# Patient Record
Sex: Female | Born: 1950 | Race: White | Hispanic: No | Marital: Married
Health system: Southern US, Community
[De-identification: ages and names within clinical notes are randomized; demographics above are authoritative.]

## PROBLEM LIST (undated history)

## (undated) DIAGNOSIS — K279 Peptic ulcer, site unspecified, unspecified as acute or chronic, without hemorrhage or perforation: Secondary | ICD-10-CM

## (undated) DIAGNOSIS — M8580 Other specified disorders of bone density and structure, unspecified site: Secondary | ICD-10-CM

## (undated) DIAGNOSIS — L509 Urticaria, unspecified: Secondary | ICD-10-CM

## (undated) DIAGNOSIS — Z9889 Other specified postprocedural states: Secondary | ICD-10-CM

## (undated) DIAGNOSIS — Z1379 Encounter for other screening for genetic and chromosomal anomalies: Principal | ICD-10-CM

## (undated) DIAGNOSIS — I739 Peripheral vascular disease, unspecified: Secondary | ICD-10-CM

## (undated) DIAGNOSIS — E785 Hyperlipidemia, unspecified: Secondary | ICD-10-CM

## (undated) DIAGNOSIS — R197 Diarrhea, unspecified: Secondary | ICD-10-CM

## (undated) DIAGNOSIS — B009 Herpesviral infection, unspecified: Secondary | ICD-10-CM

## (undated) DIAGNOSIS — C4490 Unspecified malignant neoplasm of skin, unspecified: Secondary | ICD-10-CM

## (undated) DIAGNOSIS — K219 Gastro-esophageal reflux disease without esophagitis: Secondary | ICD-10-CM

## (undated) DIAGNOSIS — E042 Nontoxic multinodular goiter: Secondary | ICD-10-CM

## (undated) HISTORY — DX: Peptic ulcer, site unspecified, unspecified as acute or chronic, without hemorrhage or perforation: K27.9

## (undated) HISTORY — DX: Encounter for other screening for genetic and chromosomal anomalies: Z13.79

## (undated) HISTORY — DX: Nontoxic multinodular goiter: E04.2

## (undated) HISTORY — DX: Other specified disorders of bone density and structure, unspecified site: M85.80

## (undated) HISTORY — DX: Hyperlipidemia, unspecified: E78.5

## (undated) HISTORY — DX: Diarrhea, unspecified: R19.7

## (undated) HISTORY — DX: Gastro-esophageal reflux disease without esophagitis: K21.9

## (undated) HISTORY — DX: Herpesviral infection, unspecified: B00.9

## (undated) HISTORY — DX: Urticaria, unspecified: L50.9

## (undated) HISTORY — DX: Peripheral vascular disease, unspecified: I73.9

## (undated) HISTORY — DX: Other specified postprocedural states: Z98.890

---

## 1998-07-22 ENCOUNTER — Other Ambulatory Visit: Admission: RE | Admit: 1998-07-22 | Discharge: 1998-07-22 | Payer: Self-pay | Admitting: Gynecology

## 1999-07-03 ENCOUNTER — Encounter: Payer: Self-pay | Admitting: Ophthalmology

## 1999-07-03 ENCOUNTER — Ambulatory Visit (HOSPITAL_COMMUNITY): Admission: RE | Admit: 1999-07-03 | Discharge: 1999-07-04 | Payer: Self-pay | Admitting: Ophthalmology

## 1999-07-28 ENCOUNTER — Other Ambulatory Visit: Admission: RE | Admit: 1999-07-28 | Discharge: 1999-07-28 | Payer: Self-pay | Admitting: Gynecology

## 1999-11-29 ENCOUNTER — Ambulatory Visit (HOSPITAL_COMMUNITY): Admission: RE | Admit: 1999-11-29 | Discharge: 1999-11-30 | Payer: Self-pay | Admitting: Ophthalmology

## 2000-08-05 ENCOUNTER — Other Ambulatory Visit: Admission: RE | Admit: 2000-08-05 | Discharge: 2000-08-05 | Payer: Self-pay | Admitting: Gynecology

## 2001-08-08 ENCOUNTER — Other Ambulatory Visit: Admission: RE | Admit: 2001-08-08 | Discharge: 2001-08-08 | Payer: Self-pay | Admitting: Gynecology

## 2002-08-28 ENCOUNTER — Other Ambulatory Visit: Admission: RE | Admit: 2002-08-28 | Discharge: 2002-08-28 | Payer: Self-pay | Admitting: Gynecology

## 2003-09-15 ENCOUNTER — Other Ambulatory Visit: Admission: RE | Admit: 2003-09-15 | Discharge: 2003-09-15 | Payer: Self-pay | Admitting: Gynecology

## 2004-07-06 ENCOUNTER — Ambulatory Visit (HOSPITAL_COMMUNITY): Admission: RE | Admit: 2004-07-06 | Discharge: 2004-07-06 | Payer: Self-pay

## 2004-09-14 ENCOUNTER — Other Ambulatory Visit: Admission: RE | Admit: 2004-09-14 | Discharge: 2004-09-14 | Payer: Self-pay | Admitting: Gynecology

## 2004-11-13 ENCOUNTER — Ambulatory Visit: Payer: Self-pay | Admitting: Family Medicine

## 2004-11-17 ENCOUNTER — Ambulatory Visit: Payer: Self-pay | Admitting: Family Medicine

## 2005-03-08 ENCOUNTER — Ambulatory Visit: Payer: Self-pay | Admitting: Family Medicine

## 2005-03-12 ENCOUNTER — Ambulatory Visit: Payer: Self-pay | Admitting: Family Medicine

## 2005-03-21 ENCOUNTER — Ambulatory Visit: Payer: Self-pay | Admitting: Family Medicine

## 2005-04-03 ENCOUNTER — Ambulatory Visit: Payer: Self-pay | Admitting: Family Medicine

## 2005-04-30 ENCOUNTER — Ambulatory Visit: Payer: Self-pay | Admitting: Family Medicine

## 2005-09-12 ENCOUNTER — Other Ambulatory Visit: Admission: RE | Admit: 2005-09-12 | Discharge: 2005-09-12 | Payer: Self-pay | Admitting: Gynecology

## 2005-11-28 ENCOUNTER — Ambulatory Visit: Payer: Self-pay | Admitting: Family Medicine

## 2006-09-12 ENCOUNTER — Other Ambulatory Visit: Admission: RE | Admit: 2006-09-12 | Discharge: 2006-09-12 | Payer: Self-pay | Admitting: Gynecology

## 2007-09-15 ENCOUNTER — Other Ambulatory Visit: Admission: RE | Admit: 2007-09-15 | Discharge: 2007-09-15 | Payer: Self-pay | Admitting: Gynecology

## 2008-09-14 ENCOUNTER — Other Ambulatory Visit: Admission: RE | Admit: 2008-09-14 | Discharge: 2008-09-14 | Payer: Self-pay | Admitting: Gynecology

## 2011-02-09 NOTE — Op Note (Signed)
[place]  Patient:    Glenda Stewart, Glenda Stewart                     MRN: [id number] Proc. Date: 11/29/99 Adm. Date:  [id number] Disc. Date: [id number] Attending:  Ernesto Rutherford                           Operative Report  PREOPERATIVE DIAGNOSES: 1. 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 2. Proliferative vitreal retinopathy, stage 1 right eye. 3. Rhegmatogenous detachment of the right eye. 4. History of repair retinal detachment, scleral buckle, retinal cryopexy    in October 2000. 5. 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  POSTOPERATIVE DIAGNOSES: 1. 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 2. Proliferative vitreal retinopathy, stage 1 right eye. 3. Rhegmatogenous detachment of the right eye. 4. History of repair retinal detachment, scleral buckle, retinal cryopexy    in October 2000. 5. 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  OPERATIONS: 1. Posterior vitrectomy with membrane peel left retinal tissues temporally    overlying the attachment. 2. Endolaser photocoagulation in a full sectorial fashion in the bed of detachment    as well as to surrounding attachment. 3. Retinal cryopexy, closed external also in the bed of the detachment and to the    edges of the retinal holes found to be at the edges of previous cryopexy scars. 4. Internal drainage of subretinal fluid after endodiathermy in the bed of the    detachment. 5. Injection of vitreous substitute SF6 20% of the right eye.  SURGEON:  Ernesto Rutherford, M.D.  ANESTHESIA:  General endotracheal anesthesia.  INDICATION:  The patient is a 60 year old woman who has had recurrent retinal detachment temporally posterior to the slope of the buckle on the right on the basis of proliferative vitreal retinopathy with ongoing pigment dispersed to the vitreous as well  as early cystoid macula edema.  She understands the need for retinal reattachment repair in an attempt to prevent further vision loss.  She understands the risks of anesthesia including the rare occurrence of death. Losses to the eye including hemorrhage, infection, scarring, need for further surgery, no change in vision, loss of vision, and progression of disease despite intervention. After appropriate signed consent was obtained, the patient was taken to the operating room.  DESCRIPTION OF PROCEDURE:  In the operating room, general endotracheal anesthesia instituted without difficulty.  The right periocular region sterilely prepped and draped in the usual ophthalmic fashion.  Lid speculum applied.  Conjunctiva peritomy fashioned in each of the three quadrants-infratemporal, supratemporal, and supranasal.  A 4 mm infusion secured 3.5 mm posterior to the limbus in the infratemporal quadrant, placed in the vitreous cavity verified visually.  Superior sclerotomy was then fashioned.  A Wild microscope placed in to position with a biome attachment.  Core vitrectomy was then begun.  Normal findings were of epiretinal tissues overlying the slope of buckle temporally in the right eye with extension over the shell of detachment posterior to the buckle of the right.  Two small retinal holes were identified at the edges of previous cryopexy applied.  These were treated ith closed retinal cryopexy.  Endodiathermy was then created in the bed of the shell of the detachment for drainage of internal subretinal fluid.  Vitreous skirt was then  trimmed 360 degrees.  All attachments to the iris and lens were visibly removed.  At this time, fluid-air exchange was then completed and drainage of subretinal fluid carried out under direct observation.  Fluid-air exchange was completed. The superior nasal sclerotomy closed with 7-0 Vicryl suture.  The air was exchanged for SF6 20% gas.  The  supratemporal sclerotomy closed.  The infusion cannula was then closed.  The infusion cannula was then removed and the sclerotomy closed with 7-0 Vicryl suture.  Conjunctiva closed with 7-0 Vicryl suture.  Subconjunctival injections of antibiotic and steroid applied inferiorly.  A sterile patch of Oxzul applied to the right eye.  Intraocular pressure had been assessed and found to e adequate less than 20 mm.  The intraocular lens remained centrally located with no displacement.  The patient tolerated the procedure well without complication. DD:  11/29/99 TD:  11/30/99 Job: [id number]

## 2016-09-24 HISTORY — PX: REDUCTION MAMMAPLASTY: SUR839

## 2017-03-08 ENCOUNTER — Other Ambulatory Visit: Payer: Self-pay | Admitting: Obstetrics & Gynecology

## 2017-03-08 DIAGNOSIS — R928 Other abnormal and inconclusive findings on diagnostic imaging of breast: Secondary | ICD-10-CM

## 2017-03-14 ENCOUNTER — Ambulatory Visit
Admission: RE | Admit: 2017-03-14 | Discharge: 2017-03-14 | Disposition: A | Discharge status: Home / Self Care | Payer: 59 | Source: Ambulatory Visit | Attending: Obstetrics & Gynecology | Admitting: Obstetrics & Gynecology

## 2017-03-14 DIAGNOSIS — R928 Other abnormal and inconclusive findings on diagnostic imaging of breast: Secondary | ICD-10-CM

## 2017-03-14 HISTORY — DX: Unspecified malignant neoplasm of skin, unspecified: C44.90

## 2017-04-11 ENCOUNTER — Other Ambulatory Visit: Payer: Medicare Other

## 2017-04-11 ENCOUNTER — Ambulatory Visit (HOSPITAL_BASED_OUTPATIENT_CLINIC_OR_DEPARTMENT_OTHER): Payer: Medicare Other | Admitting: Genetics

## 2017-04-11 ENCOUNTER — Encounter: Payer: Self-pay | Admitting: Genetics

## 2017-04-11 DIAGNOSIS — Z315 Encounter for genetic counseling: Secondary | ICD-10-CM

## 2017-04-11 DIAGNOSIS — Z803 Family history of malignant neoplasm of breast: Secondary | ICD-10-CM

## 2017-04-11 DIAGNOSIS — Z808 Family history of malignant neoplasm of other organs or systems: Secondary | ICD-10-CM | POA: Diagnosis not present

## 2017-04-11 DIAGNOSIS — Z809 Family history of malignant neoplasm, unspecified: Secondary | ICD-10-CM

## 2017-04-11 DIAGNOSIS — Z8041 Family history of malignant neoplasm of ovary: Secondary | ICD-10-CM | POA: Diagnosis not present

## 2017-04-11 DIAGNOSIS — Z85828 Personal history of other malignant neoplasm of skin: Secondary | ICD-10-CM

## 2017-04-11 DIAGNOSIS — Z800 Family history of malignant neoplasm of digestive organs: Secondary | ICD-10-CM | POA: Diagnosis not present

## 2017-04-11 NOTE — Progress Notes (Signed)
REFERRING PROVIDER: Juanda Chance, NP [address] [place], [place] [address]  PRIMARY PROVIDER:  Juanda Chance, NP  PRIMARY REASON FOR VISIT:  1. Family history of breast cancer   2. Family history of ovarian cancer   3. Family history of melanoma   4. Family history of colon cancer   5. Family history of brain cancer   6. Family history of cancer   7. Personal history of skin cancer      HISTORY OF PRESENT ILLNESS:   [name], a 66 y.o. female, was seen for a [place] cancer genetics consultation at the request of Glenda Stewart, [facility] due to a family history of cancer.  [name] presents to clinic today to discuss the possibility of a hereditary predisposition to cancer, genetic testing, and to further clarify her future cancer risks, as well as potential cancer risks for family members.   CANCER HISTORY: Earlier this year, at the age of 81, 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 This was treated with resection only.    HORMONAL RISK FACTORS:  Menarche was at age ~89.  First live birth at age 48.  Ovaries intact: no. TAH/BSO in her early-40s. Per [name]'s report, unsure if all of her ovaries were removed due to so much scar tissue. No evidence of ovarian tissue on recent exams. Hysterectomy: yes.  Menopausal status: postmenopausal.  HRT use: 1 year following TAH/BSO. Colonoscopy: yes; normal. Mammogram within the last year: yes. Number of breast biopsies: 0. Up to date with pelvic exams:  yes. Any excessive radiation exposure in the past:  no  Past Medical History:  Diagnosis Date  . Skin cancer     No past surgical history on file.  Social History   Social History  . Marital status: Married    Spouse name: N/A  . Number of children: N/A  . Years of education: N/A   Social History Main Topics  . Smoking status: Not on file  . Smokeless tobacco: Not on file  . Alcohol use Not on file  . Drug use:  Unknown  . Sexual activity: Not on file   Other Topics Concern  . Not on file   Social History Narrative  . No narrative on file     FAMILY HISTORY:  We obtained a detailed, 4-generation family history.  Significant diagnoses are listed below: Family History  Problem Relation Age of Onset  . Breast cancer Mother 86       d.92 bilateral breast cancers diagnosed at 94 and 22  . Breast cancer Sister 28       treated with lumpectomy and radiation  . Breast cancer Maternal Aunt 85       d.90  . Breast cancer Cousin 85  . Brain cancer Cousin 18       Neuroblastoma treated with resection and radiation. Daughter of maternal aunt Glenda Stewart.  . Breast cancer Maternal Aunt 60       d.87  . Ovarian cancer Maternal Aunt 78  . Breast cancer Cousin 84       Daughter of maternal aunt Glenda Stewart.  . Early death Maternal Uncle        d.child from unknown cause  . Leukemia Paternal Uncle        d.68s  . Cancer Paternal Uncle        d.60s unspecified type of cancer  . Colon cancer Cousin 58  Son of maternal aunt Glenda Stewart.  . Cancer Cousin        Daughter of paternal uncle Glenda Stewart. Treated at [facility] for unspecified form of cancer.   Glenda Stewart has a twin sister, Glenda Stewart, who had melanoma of her shoulder at age 59. Another sister, Glenda Stewart, was diagnosed with breast cancer at age 59 and is currently 82. Ms. Dicarlo brother, Glenda Stewart, has not had cancer. [name] has one biological son, age 73, who is without cancers. She also has an adopted son (no biological relationship) who is 54.  [name] mother died at age 6 from causes unrelated to cancer. However, her mother had a history of bilateral breast cancers diagnosed at ages 45 and 35. [name] only maternal uncle died as a child from unknown causes, though she does not think that he had a history of cancer. Both of [name]'s maternal aunts have had cancer. Her aunt Glenda Stewart was diagnosed with breast cancer at age 66 and died of other causes at  29. Glenda Stewart has two daughters and a son. Her daughter, Glenda Stewart, had neuroblastoma at age 95 and was treated with resection and radiation. At age 38, Glenda Stewart was diagnosed with breast cancer. Glenda Stewart's son is currently 30 and has a history of colon cancer at age 61. [name] aunt Glenda Stewart was diagnosed with breast cancer in her 54s, ovarian cancer in her late-70s, and died at 106. Glenda Stewart had two sons and a daughter. Her daughter, Glenda Stewart, was diagnosed with breast cancer at age 72 and is currently 30. [name] maternal grandmother died at 38 without cancer. Her maternal grandfather died after age 53 and was in a nursing home prior to his passing.  [name] father died at 9 without cancers. [name] had three paternal uncles and a paternal aunt. One uncle, Glenda Stewart, died in his late-60s with leukemia. Another uncle, Glenda Stewart, died in his 64s with an unspecified form of cancer. Glenda Stewart's daughter currently has an unspecified form of cancer and is being treated at [facility]. [name] third uncle, Glenda Stewart, died at 2 without cancers. Her aunt, Glenda Stewart, died at 12 without cancers. [name] paternal grandparents died in their 78s and 51s from heart problems and did not have cancers.   [name] thinks that her mother, sister Glenda Stewart), and maternal aunt Glenda Stewart) may have had genetic testing. To her knowledge, their testing was negative, but she does not have documentation of their testing or results. She states that though she may be able to obtain a copy of Glenda Stewart' results, she does not think she will be able to obtain her mother's or aunt 72. Patient's maternal and paternal ancestors are of Caucasian descent. There is no reported Ashkenazi Jewish ancestry. There is no known consanguinity.  GENETIC COUNSELING ASSESSMENT: Glenda Stewart is a 66 y.o. female with a family history that is suggestive of a hereditary cancer syndrome and predisposition to cancer. We, therefore, discussed and recommended the  following at today's visit.   DISCUSSION: We reviewed the characteristics, features and inheritance patterns of hereditary cancer syndromes. We also discussed genetic testing, including the appropriate family members to test, the process of testing, insurance coverage and turn-around-time for results. We discussed the implications of a negative, positive and/or variant of uncertain significant result. We recommended [name] pursue genetic testing for the Multi-Cancers Panel offered by [facility]. [facility]'s Multi-Cancer Panel includes analysis of the following 83 genes: ALK, APC, ATM, AXIN2, BAP1, BARD1, BLM, BMPR1A, BRCA1, BRCA2, BRIP1, CASR, CDC73, CDH1, CDK4, CDKN1B,  CDKN1C, CDKN2A, CEBPA, CHEK2, CTNNA1, DICER1, DIS3L2, EGFR, EPCAM, FH, FLCN, GATA2, GPC3, GREM1, HOXB13, HRAS, KIT, MAX, MEN1, MET, MITF, MLH1, MSH2, MSH3, MSH6, MUTYH, NBN, NF1, NF2, NTHL1, PALB2, PDGFRA, PHOX2B, PMS2, POLD1, POLE, POT1, PRKAR1A, PTCH1, PTEN, RAD50, RAD51C, RAD51D, RB1, RECQL4, RET, RUNX1, SDHA, SDHAF2, SDHB, SDHC, SDHD, SMAD4, SMARCA4, SMARCB1, SMARCE1, STK11, SUFU, TERC, TERT, TMEM127, TP53, TSC1, TSC2, VHL, WRN, WT1.  Based on [name]'s family history of cancer, she meets medical criteria for genetic testing. Despite that she meets criteria, she may still have an out of pocket cost. We discussed that if her out of pocket cost for testing is over $100, the laboratory will call and confirm whether she wants to proceed with testing.  If the out of pocket cost of testing is less than $100 she will be billed by the genetic testing laboratory.   The Tyrer-Cuzick (IBIS) Breast Cancer Risk Model was used to estimate [name]'s lifetime breast cancer risk. This model estimates risk through analysis of personal health history and family history. According to this model, her lifetime risk of developing breast cancer is 7.7%. For comparison, [name] lifetime breast cance risk was also estimated using the [place]. The [employer] model estimates Ms. Pascoe's Stewart to have breast cancer to age 69 to be 17.9%. This estimation assumes that Ms. Colgate's genetic testing is negative. If [name]'s genetic testing identifies a mutation, her cancer risks and management will likely be based on the presence of that mutation rather than this model, as it will likely confer a higher risk. A summary of both risk models is provided at the end of this note for reference.  Though [name]'s lifetime breast cancer risk does not appear to exceed the 20% threshold for high-risk screening/management, [name] expressed concern for her breast cancer risk and a desire to pursue bilateral mastectomies. We discussed that there certainly appears to be a clustering of breast cancer in her family which suggests familial risk. Furthermore, many of the breast cancers in her family members were diagnosed at later ages. Therefore, I encouraged [name] to discuss her individual situation with her referring physician and determine a breast cancer screening/risk reduction plan with which they are both comfortable.   PLAN: After considering the risks, benefits, and limitations, [name]  provided informed consent to pursue genetic testing and the blood sample was sent to [facility] for analysis of the 83-gene Multi-Cancers Panel. Results should be available within approximately 3 weeks' time, at which point they will be disclosed by telephone to [name], as will any additional recommendations warranted by these results. This information will also be available in Epic.   Lastly, we encouraged [name] to remain in contact with cancer genetics annually so that we can continuously update the family history and inform her of any changes in cancer genetics and testing that may be of benefit for this family.   Ms.  Glenda Stewart questions were answered to her satisfaction today. Our contact information  was provided should additional questions or concerns arise. Thank you for the referral and allowing [place] to Stewart in the care of your patient.   Mal Misty, MS, [facility] Certified Naval [email or web address] phone: [phone]  The patient was seen for a total of 40 minutes in face-to-face genetic counseling.    _______________________________________________________________________ For Office Staff:  Number of people involved in session: 1 Was an Intern/ student involved with case: no    Tyrer-Cuzick (IBIS) Breast Cancer  Risk Model Summary    [employer]aker [employer] Breast Cancer Risk Model Summary

## 2017-04-23 ENCOUNTER — Ambulatory Visit: Payer: Self-pay | Admitting: Genetics

## 2017-04-23 ENCOUNTER — Encounter: Payer: Self-pay | Admitting: Genetics

## 2017-04-23 ENCOUNTER — Telehealth: Payer: Self-pay | Admitting: Genetics

## 2017-04-23 DIAGNOSIS — Z1379 Encounter for other screening for genetic and chromosomal anomalies: Secondary | ICD-10-CM

## 2017-04-23 HISTORY — DX: Encounter for other screening for genetic and chromosomal anomalies: Z13.79

## 2017-04-23 NOTE — Progress Notes (Signed)
HPI: Ms. Spraggins was previously seen in the [facility] clinic on 04/11/2017 due to a family history of cancer and concerns regarding a hereditary predisposition to cancer. Please refer to our prior cancer genetics clinic note for more information regarding Ms. Egge's medical, social and family histories, and our assessment and recommendations, at the time. Ms. Lint recent genetic test results were disclosed to her, as were recommendations warranted by these results. These results and recommendations are discussed in more detail below.  CANCER HISTORY: Earlier this year, at the age of 36, Ms. 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 This was treated with resection only.    FAMILY HISTORY:  We obtained a detailed, 4-generation family history.  Significant diagnoses are listed below: Family History  Problem Relation Age of Onset  . Breast cancer Mother 57       d.92 bilateral breast cancers diagnosed at 24 and 77  . Breast cancer Sister 99       treated with lumpectomy and radiation  . Breast cancer Maternal Aunt 85       d.90  . Breast cancer Cousin 39  . Brain cancer Cousin 18       Neuroblastoma treated with resection and radiation. Daughter of maternal aunt [name].  . Breast cancer Maternal Aunt 60       d.87  . Ovarian cancer Maternal Aunt 78  . Breast cancer Cousin 45       Daughter of maternal aunt [name].  . Early death Maternal Uncle        d.child from unknown cause  . Leukemia Paternal Uncle        d.68s  . Cancer Paternal Uncle        d.60s unspecified type of cancer  . Colon cancer Cousin 56       Son of maternal aunt [name].  . Cancer Cousin        Daughter of paternal uncle Rod. Treated at [facility] for unspecified form of cancer.   Ms. Cumpton has a twin sister, [name], who had melanoma of her shoulder at age 56. Another sister, [name], was diagnosed with breast cancer at age 71 and is currently 51. Ms. Stroh brother,  [name], has not had cancer. Ms. Iman has one biological son, age 48, who is without cancers. She also has an adopted son (no biological relationship) who is 24.  Ms. Mooradian mother died at age 37 from causes unrelated to cancer. However, her mother had a history of bilateral breast cancers diagnosed at ages 82 and 60. Ms. Westergren only maternal uncle died as a child from unknown causes, though she does not think that he had a history of cancer. Both of Ms. Hillman's maternal aunts have had cancer. Her aunt [name] was diagnosed with breast cancer at age 25 and died of other causes at 62. [name] has two daughters and a son. Her daughter, [name], had neuroblastoma at age 70 and was treated with resection and radiation. At age 47, [name] was diagnosed with breast cancer. [name]'s son is currently 45 and has a history of colon cancer at age 24. Ms. Paro aunt [name] was diagnosed with breast cancer in her 94s, ovarian cancer in her late-70s, and died at 63. [name] had two sons and a daughter. Her daughter, [name], was diagnosed with breast cancer at age 61 and is currently 57. Ms. Koble maternal grandmother died at 31 without cancer. Her maternal grandfather died after age 1 and was  in a nursing home prior to his passing.  Ms. Shaheen father died at 67 without cancers. Ms. Gierke had three paternal uncles and a paternal aunt. One uncle, [name], died in his late-60s with leukemia. Another uncle, Rod, died in his 71s with an unspecified form of cancer. Rod's daughter currently has an unspecified form of cancer and is being treated at [facility]. Ms. Mccarrell third uncle, [name], died at 3 without cancers. Her aunt, [name], died at 50 without cancers. Ms. Azure paternal grandparents died in their 78s and 79s from heart problems and did not have cancers.   Ms. Masters thinks that her mother, sister [name]), and maternal aunt [name]) may have had genetic testing. To her knowledge, their testing  was negative, but she does not have documentation of their testing or results. She states that though she may be able to obtain a copy of [name]' results, she does not think she will be able to obtain her mother's or aunt 15. Patient's maternal and paternal ancestors are of Caucasian descent. There is no reported Ashkenazi Jewish ancestry. There is no known consanguinity.  GENETIC TEST RESULTS: Genetic testing performed through [facility]'s Multi-Cancers Panel reported out on 04/18/2017 showed no pathogenic mutations. [facility] Multi-Cancer Panel includes analysis of the following 83 genes: ALK, APC, ATM, AXIN2, BAP1, BARD1, BLM, BMPR1A, BRCA1, BRCA2, BRIP1, CASR, CDC73, CDH1, CDK4, CDKN1B, CDKN1C, CDKN2A, CEBPA, CHEK2, CTNNA1, DICER1, DIS3L2, EGFR, EPCAM, FH, FLCN, GATA2, GPC3, GREM1, HOXB13, HRAS, KIT, MAX, MEN1, MET, MITF, MLH1, MSH2, MSH3, MSH6, MUTYH, NBN, NF1, NF2, NTHL1, PALB2, PDGFRA, PHOX2B, PMS2, POLD1, POLE, POT1, PRKAR1A, PTCH1, PTEN, RAD50, RAD51C, RAD51D, RB1, RECQL4, RET, RUNX1, SDHA, SDHAF2, SDHB, SDHC, SDHD, SMAD4, SMARCA4, SMARCB1, SMARCE1, STK11, SUFU, TERC, TERT, TMEM127, TP53, TSC1, TSC2, VHL, WRN, WT1.  The test report will be scanned into EPIC and will be located under the Molecular Pathology section of the Results Review tab.A portion of the result report is included below for reference.    We discussed with Ms. Silfies that since the current genetic testing is not perfect, it is possible there may be a gene mutation in one of these genes that current testing cannot detect, but that chance is small. We also discussed, that it is possible that another gene that has not yet been discovered, or that we have not yet tested, is responsible for the cancer diagnoses in the family. Therefore, important to remain in touch with cancer genetics in the future so that we can continue to offer Ms. Remmers the most up to date genetic testing.   CANCER SCREENING RECOMMENDATIONS: This normal  result indicates that it is unlikely Ms. Chock has an increased risk of cancer due to a mutation in one of these genes. However, given Ms. Poppe's family history of cancers, we must interpret these negative results with some caution.  Families with features suggestive of hereditary risk for cancer tend to have multiple family members with cancer, diagnoses in multiple generations and diagnoses before the age of 64. Ms. Rings family exhibits some of these features. Thus this result may simply reflect our current inability to detect all mutations within these genes or there may be a different gene that has not yet been discovered or tested. Because no causative or actionable mutations were identified, Ms. Morey was advised to continue following the cancer screening guidelines provided by her primary healthcare providers.   The Tyrer-Cuzick (IBIS) Breast Cancer Risk Model wasused to estimate Ms. Wrobleski's lifetime breast cancer risk. This model estimates risk through analysis  of personal health history and family history. According to this model, herlifetime risk of developing breast cancer is 7.7%. For comparison, Ms. Dlugosz lifetime breast cance risk was also estimated using the [place]. The Baker Janus model estimates Ms. Geister's chance to have breast cancer to age 59 to be 17.9%. A summary of both risk models is provided at the end of this note for reference.  Though Ms. Isaacs's lifetime breast cancer risk does not appear to exceed the 20% threshold for high-risk screening/management, Ms. Jemmott expressed concern for her breast cancer risk and a desire to pursue bilateral mastectomies. We discussed that there certainly appears to be a clustering of breast cancer in her family which suggests familial risk. Furthermore, many of the breast cancers in her family members were diagnosed at later ages. Therefore, I encouraged Ms. Chauncey to discuss her individual situation  with her referring physician and determine a breast cancer screening/risk reduction plan with which they are both comfortable.  RECOMMENDATIONS FOR FAMILY MEMBERS: Women in this family might be at some increased risk of developing cancer, over the general population risk, simply due to the family history of cancer. We recommended women in this family have a yearly mammogram beginning at age 67, or 84 years younger than the earliest onset of cancer, an annual clinical breast exam, and perform monthly breast self-exams. Women in this family should also have a gynecological exam as recommended by their primary provider. All family members should have a colonoscopy by age 55.  Ms. Kowalchuk reports that her mother, sister [name], and maternal aunt [name] have all had negative genetic testing. However, their results were not available for review to determine the extent of their testing. Therefore, I recommend that any of Ms. Terwilliger's affected living relatives consult their physicians and/or a genetic counselor regarding their previous genetic testing and whether additional testing is indicated. Other, unaffected, family members also appear to be candidates for genetic counseling and testing and should consult their physicians about their options. Ms. Lukins was encouraged to obtain a copy of any tested family members' genetic testing result reports as this could give [place] valuable information to assist [place] in interpretation of Ms. Boettner's negative results.   FOLLOW-UP: Lastly, we discussed with Ms. Bobak that cancer genetics is a rapidly advancing field and it is possible that new genetic tests will be appropriate for her and/or her family members in the future. We encouraged her to remain in contact with cancer genetics on an annual basis so we can update her personal and family histories and let her know of advances in cancer genetics that may benefit this family.   Our contact number was provided. Ms.  Gowens questions were answered to her satisfaction, and she knows she is welcome to call us at anytime with additional questions or concerns.   Mal Misty, MS, [facility] Certified Genetic Counselor [email or web address]    Tyrer-Cuzick (IBIS) Breast Cancer Risk Model Summary    [facility]ail Breast Cancer Risk Model Summary

## 2017-04-23 NOTE — Telephone Encounter (Deleted)
-----   Message from Mal Misty sent at 04/11/2017  1:00 PM EDT ----- Regarding: Call Results TC risk 7.7% Gail risk ~18%. Pt desires bilateral mastectomy. Encourage to discuss option with referring provider/surgeon. Route results to Juanda Chance, [facility], [facility].

## 2017-04-23 NOTE — Telephone Encounter (Signed)
Reviewed that germline genetic testing revealed no pathogenic mutations. This is considered to be a negative result. Testing was performed through [facility]'s 83-gene Multi-Cancers Panel. [facility]'s Multi-Cancer Panel includes analysis of the following 83 genes: ALK, APC, ATM, AXIN2, BAP1, BARD1, BLM, BMPR1A, BRCA1, BRCA2, BRIP1, CASR, CDC73, CDH1, CDK4, CDKN1B, CDKN1C, CDKN2A, CEBPA, CHEK2, CTNNA1, DICER1, DIS3L2, EGFR, EPCAM, FH, FLCN, GATA2, GPC3, GREM1, HOXB13, HRAS, KIT, MAX, MEN1, MET, MITF, MLH1, MSH2, MSH3, MSH6, MUTYH, NBN, NF1, NF2, NTHL1, PALB2, PDGFRA, PHOX2B, PMS2, POLD1, POLE, POT1, PRKAR1A, PTCH1, PTEN, RAD50, RAD51C, RAD51D, RB1, RECQL4, RET, RUNX1, SDHA, SDHAF2, SDHB, SDHC, SDHD, SMAD4, SMARCA4, SMARCB1, SMARCE1, STK11, SUFU, TERC, TERT, TMEM127, TP53, TSC1, TSC2, VHL, WRN, WT1.  For more detailed discussion, please see genetic counseling documentation from 04/23/2017. Result report dated 04/18/2017.

## 2018-04-17 ENCOUNTER — Other Ambulatory Visit: Payer: Self-pay | Admitting: Obstetrics & Gynecology

## 2018-04-17 DIAGNOSIS — R928 Other abnormal and inconclusive findings on diagnostic imaging of breast: Secondary | ICD-10-CM

## 2018-04-29 ENCOUNTER — Ambulatory Visit
Admission: RE | Admit: 2018-04-29 | Discharge: 2018-04-29 | Disposition: A | Discharge status: Home / Self Care | Payer: Medicare Other | Source: Ambulatory Visit | Attending: Obstetrics & Gynecology | Admitting: Obstetrics & Gynecology

## 2018-04-29 ENCOUNTER — Ambulatory Visit: Payer: Medicare Other

## 2018-04-29 ENCOUNTER — Other Ambulatory Visit: Payer: Self-pay | Admitting: Obstetrics & Gynecology

## 2018-04-29 DIAGNOSIS — R928 Other abnormal and inconclusive findings on diagnostic imaging of breast: Secondary | ICD-10-CM

## 2018-04-29 DIAGNOSIS — N6489 Other specified disorders of breast: Secondary | ICD-10-CM

## 2018-10-31 ENCOUNTER — Ambulatory Visit
Admission: RE | Admit: 2018-10-31 | Discharge: 2018-10-31 | Disposition: A | Discharge status: Home / Self Care | Payer: Medicare Other | Source: Ambulatory Visit | Attending: Obstetrics & Gynecology | Admitting: Obstetrics & Gynecology

## 2018-10-31 ENCOUNTER — Other Ambulatory Visit: Payer: Self-pay | Admitting: Obstetrics & Gynecology

## 2018-10-31 ENCOUNTER — Ambulatory Visit: Payer: Medicare Other

## 2018-10-31 DIAGNOSIS — N6489 Other specified disorders of breast: Secondary | ICD-10-CM

## 2018-11-03 ENCOUNTER — Other Ambulatory Visit: Payer: Self-pay | Admitting: Obstetrics & Gynecology

## 2019-05-01 ENCOUNTER — Other Ambulatory Visit: Payer: Medicare Other

## 2021-01-20 IMAGING — MG DIGITAL DIAGNOSTIC UNILATERAL LEFT MAMMOGRAM WITH TOMO AND CAD
4 series · 4 of 12 positions shown · non-contrast
Comparison: 05/09/2018

CLINICAL DATA: Six-month follow-up for asymmetry in the LEFT
breast. Patient had bilateral breast reduction in 8410.

EXAM:
DIGITAL DIAGNOSTIC UNILATERAL LEFT MAMMOGRAM WITH CAD AND TOMO

[L CC synth-2D]
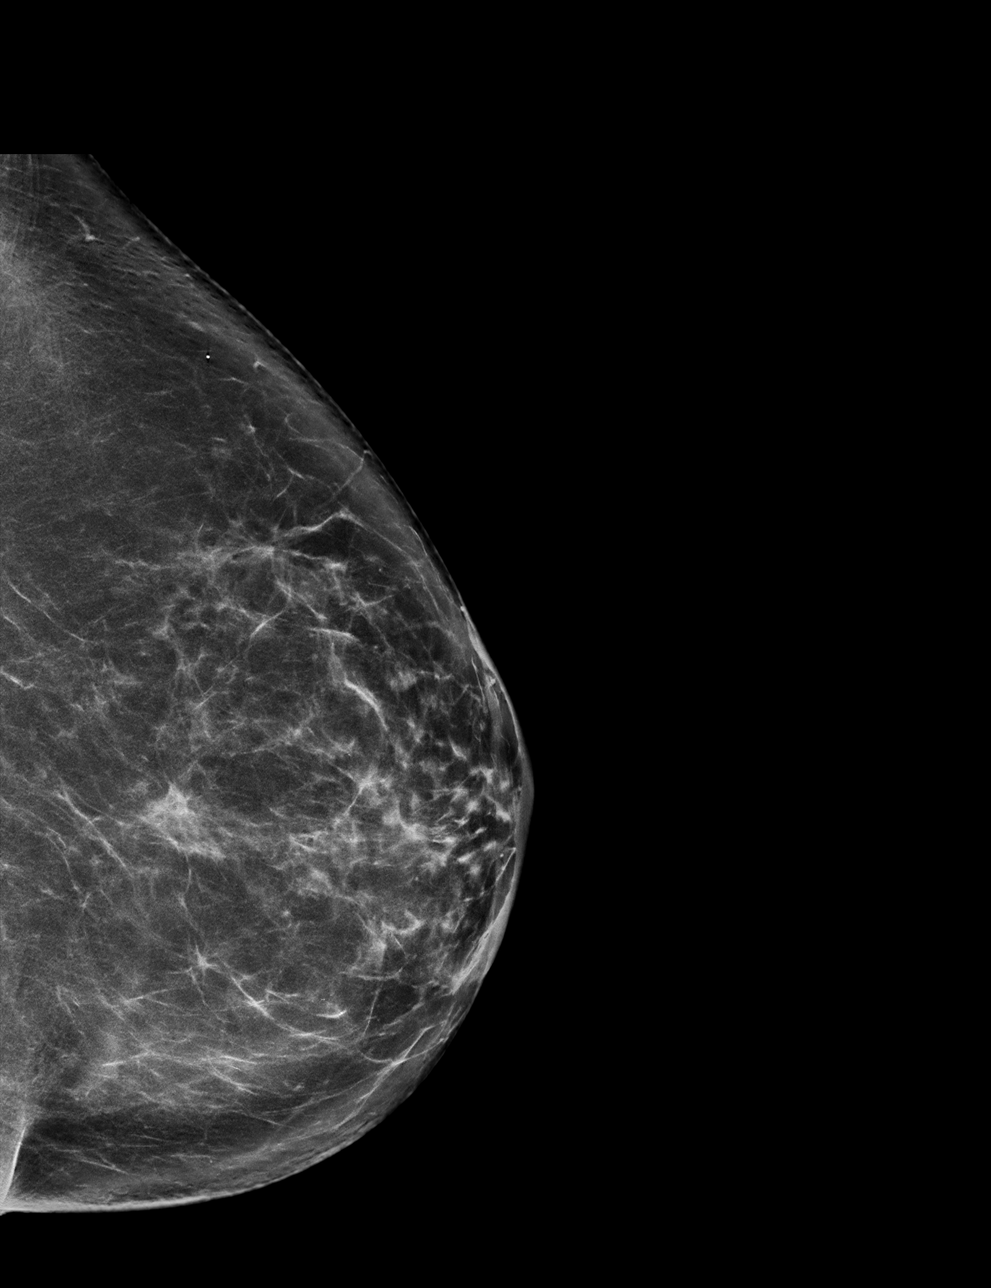

[L MLO synth-2D]
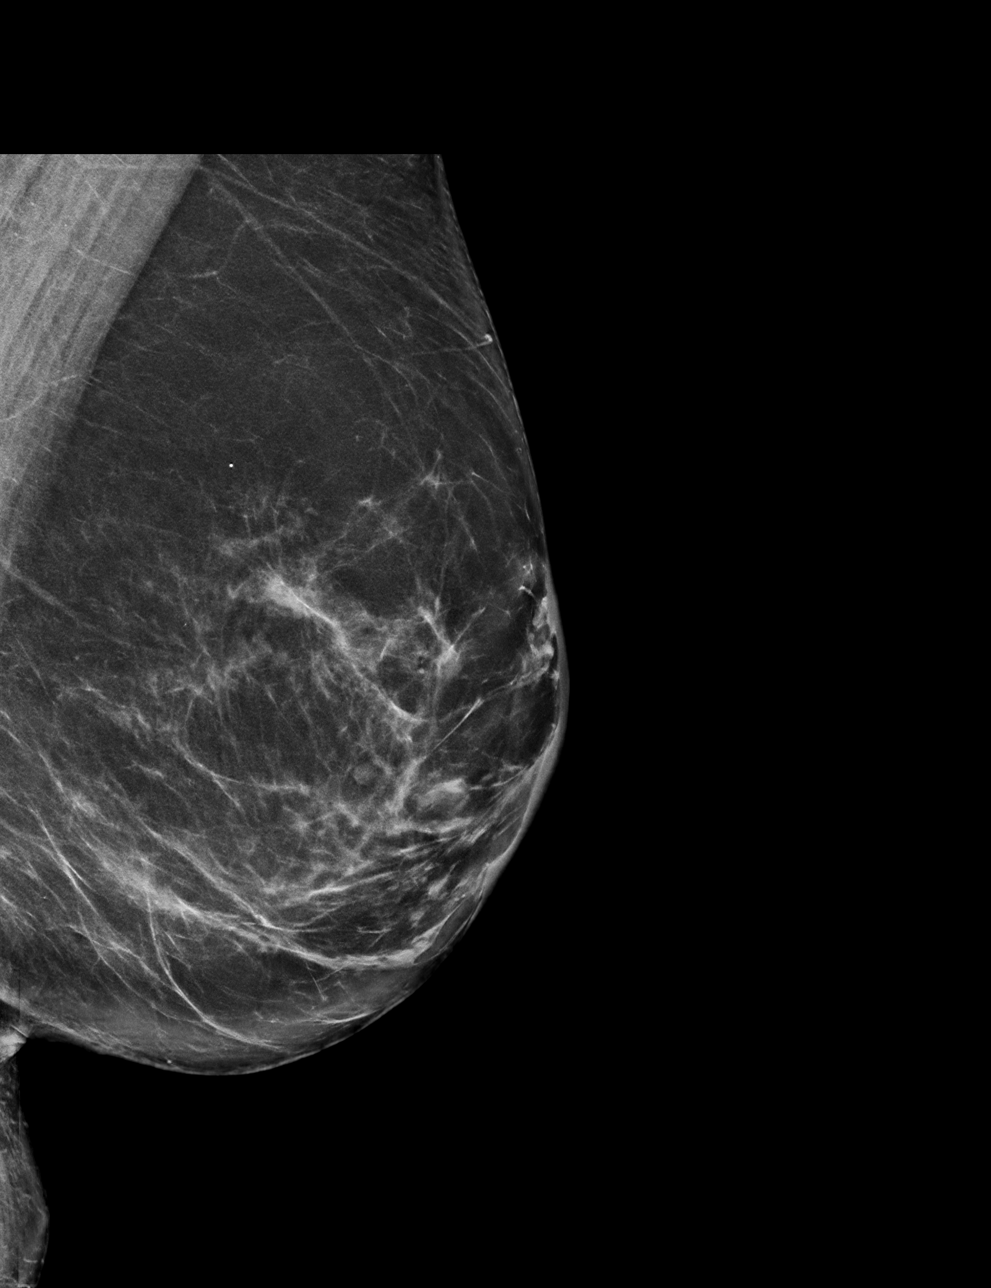

[L CC tomo · tomo slice 39/78.0]
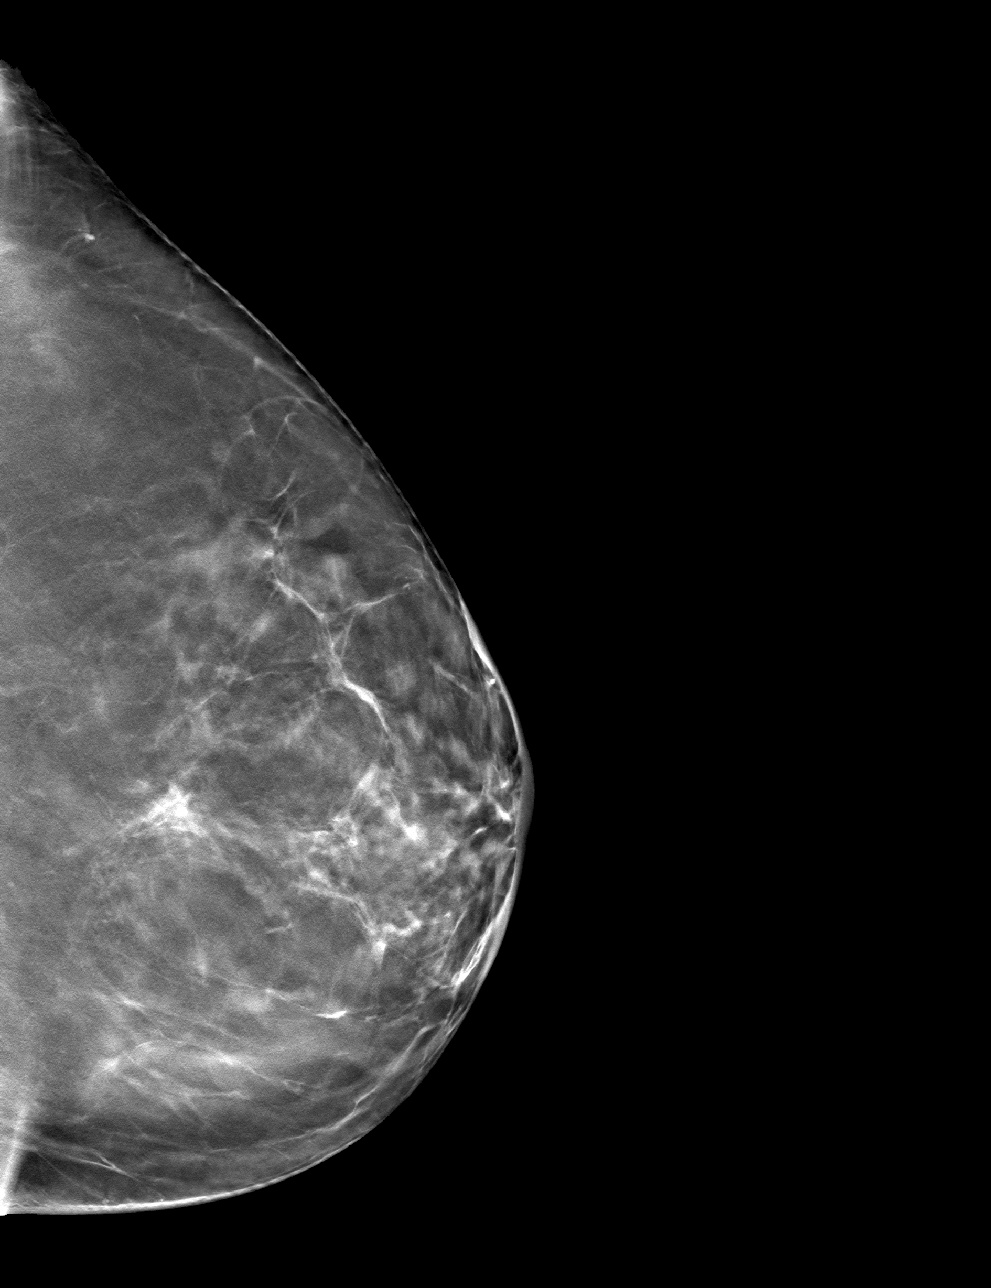

[L MLO tomo · tomo slice 39/78.0]
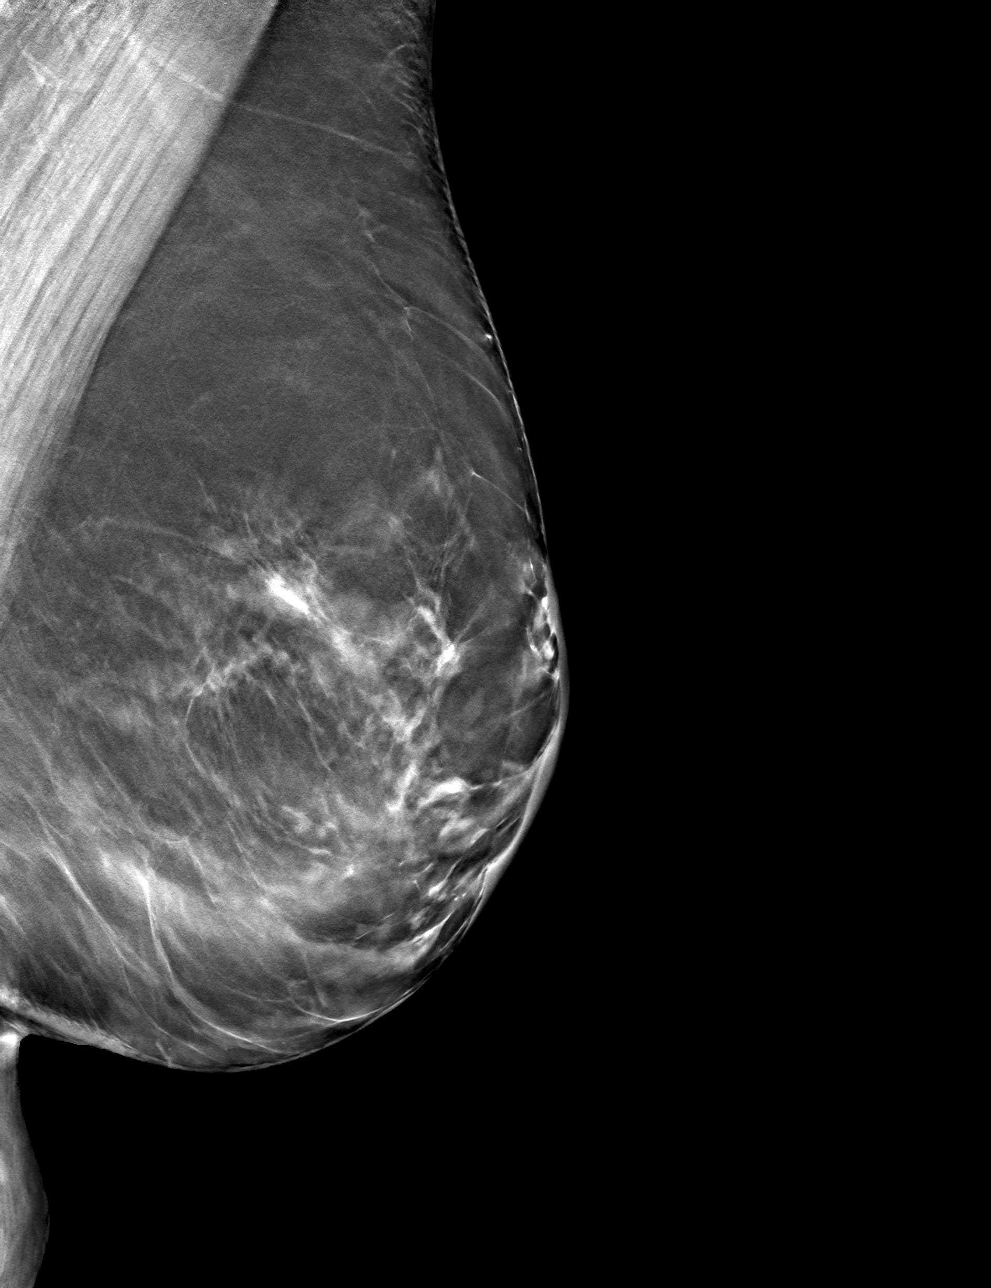

[4 of 12 positions shown; findings below may reference images not displayed]

ACR Breast Density Category b: There are scattered areas of
fibroglandular density.
FINDINGS: There is persistent asymmetry in the UPPER central portion of the
LEFT breast, not associated distortion or discrete mass. Findings
are favored to represent asymmetric benign breast parenchyma versus
scar from prior surgery. No suspicious mass, distortion, or
microcalcifications are identified to suggest presence of
malignancy.

Mammographic images were processed with CAD.
IMPRESSION: No mammographic evidence for malignancy.

RECOMMENDATION:
Bilateral diagnostic mammogram is recommended in 6 months.

I have discussed the findings and recommendations with the patient.
Results were also provided in writing at the conclusion of the
visit. If applicable, a reminder letter will be sent to the patient
regarding the next appointment.

BI-RADS CATEGORY  3: Probably benign.

## 2021-05-16 NOTE — H&P (Signed)
 [facility]  History and Physical   Assessment  1. S/p B-breast reconstruction with ptosis and deformity of reconstruction  Plan  1. B-mastopexy  2. Possible B-implant exchange  History  Glenda Stewart is a 70 y.o. female who presents with implant based breast reconstruction History of Present Illness For surgery  Past Medical History:  Diagnosis Date  . 2019 novel coronavirus disease (COVID-19) 02/2021   Tested positive with home kit  . Anesthesia    Hard time waking up & smothering  . Anxiety    per pt  . Chronic kidney disease 2017   stage 3  . Compromised kidney function    per pt 50%  . Dental crown present    per pt  . H/O vocal cord paralysis   . Hypertension    per pt  . Stomach ulcer    per pt   Past Surgical History:  Procedure Laterality Date  . Appendectomy  1984   per pt  . Breast reconstruction Bilateral 02/18/2019   Dr Rudy   . Cataract extraction, bilateral  1993   per pt  . Cholecystectomy  2007   per pt  . Hysterectomy  1996   per pt  . Larynx surgery  2006   per pt had trach removed prior to leaving hospital  . Mastectomy Bilateral 02/18/2019   Dr Corinne   . Reduction mammaplasty Bilateral 06/2017   Dr Rudy   . Retinal detachment surgery     per pt    No Known Allergies Prior to Admission medications   Medication Sig Start Date End Date Taking? Authorizing Provider  alpha-tocopherol (VITAMIN E) 200 UNIT capsule Take 200 Units by mouth every morning.     Historical Provider, MD  amLODIPine besylate (NORVASC) 10 mg tablet Take 10 mg by mouth every morning.  11/08/18   Historical Provider, MD  Calcium Citrate (CITRACAL PO) Take 1,200 mg by mouth daily.     Historical Provider, MD  Docusate Sodium (STOOL SOFTENER LAXATIVE PO) Take by mouth every morning.    Historical Provider, MD  Doxylamine Succinate, Sleep, (UNISOM PO) Take by mouth at bedtime.    Historical Provider, MD  escitalopram oxalate  (LEXAPRO) 10 mg tablet Take 10 mg by mouth every evening.  11/08/18   Historical Provider, MD  ezetimibe (ZETIA) 10 MG tablet Take 10 mg by mouth every morning.  09/28/18   Historical Provider, MD  Melatonin 1 MG/4ML LIQD Take by mouth at bedtime.     Historical Provider, MD  Multiple Vitamin (MULTIVITAMIN) tablet Take 1 tablet by mouth every morning.     Historical Provider, MD  Multiple Vitamins-Minerals (OCUVITE-LUTEIN PO) Take by mouth at bedtime.    Historical Provider, MD  pantoprazole sodium (PROTONIX) 40 mg tablet Take 40 mg by mouth every morning.  11/08/18   Historical Provider, MD  promethazine (PHENERGAN) 12.5 MG tablet Take one tablet (12.5 mg dose) by mouth every 6 (six) hours as needed for Nausea for up to 7 days. Patient not taking: Reported on 02/26/2019 02/18/19 02/25/19  Lynwood KANDICE Rudy, MD  rosuvastatin calcium (CRESTOR) 40 mg tablet Take 40 mg by mouth at bedtime.  09/26/18   Historical Provider, MD  VITAMIN D, CHOLECALCIFEROL, PO Take by mouth at bedtime.     Historical Provider, MD   Social History   Socioeconomic History  . Marital status: Married    Spouse name: Not on file  . Number of children: Not on file  .  Years of education: Not on file  . Highest education level: Not on file  Occupational History  . Not on file  Tobacco Use  . Smoking status: Never Smoker  . Smokeless tobacco: Never Used  Vaping Use  . Vaping Use: Never used  Substance and Sexual Activity  . Alcohol use: Yes    Comment: 2 drinks per week  . Drug use: Never  . Sexual activity: Not on file  Other Topics Concern  . Not on file  Social History Narrative  . Not on file   Social Determinants of Health   Financial Resource Strain: Not on file  Food Insecurity: Not on file  Transportation Needs: Not on file  Physical Activity: Not on file  Stress: No Stress Concern Present  . Feeling of Stress : Not at all  Social Connections: Not on file  Intimate Partner Violence: Not on file  Housing  Stability: Not on file   No family history on file. Review of Systems:  (-) fevers, chills, night sweats, CP, SOB       Physical Examination           Physical Exam:  CV:  RRR, (-) M/R/G Chest:  Good air movement, CTA-B Abdomen: Soft, NTND  Results  Labs:  No results found for this or any previous visit (from the past 24 hour(s)). Imaging: No results found.   ECG: No results found.  Electronically signed: Lynwood KANDICE Centers, MD 05/16/2021 / 2:12 PM

## 2023-04-09 ENCOUNTER — Other Ambulatory Visit: Payer: Self-pay | Admitting: Obstetrics & Gynecology

## 2023-04-09 DIAGNOSIS — E049 Nontoxic goiter, unspecified: Secondary | ICD-10-CM

## 2023-04-16 ENCOUNTER — Ambulatory Visit
Admission: RE | Admit: 2023-04-16 | Discharge: 2023-04-16 | Disposition: A | Discharge status: Home / Self Care | Payer: Medicare PPO | Source: Ambulatory Visit | Attending: Obstetrics & Gynecology | Admitting: Obstetrics & Gynecology

## 2023-04-16 DIAGNOSIS — E049 Nontoxic goiter, unspecified: Secondary | ICD-10-CM

## 2024-06-22 ENCOUNTER — Other Ambulatory Visit: Payer: Self-pay

## 2024-06-22 ENCOUNTER — Inpatient Hospital Stay: Attending: Hematology and Oncology | Admitting: Hematology and Oncology

## 2024-06-22 ENCOUNTER — Encounter: Payer: Self-pay | Admitting: Hematology and Oncology

## 2024-06-22 ENCOUNTER — Telehealth: Payer: Self-pay | Admitting: Hematology and Oncology

## 2024-06-22 ENCOUNTER — Other Ambulatory Visit: Payer: Self-pay | Admitting: Hematology and Oncology

## 2024-06-22 ENCOUNTER — Inpatient Hospital Stay

## 2024-06-22 VITALS — BP 139/66 | HR 58 | Temp 97.7°F | Resp 14 | Ht 64.5 in | Wt 130.3 lb

## 2024-06-22 DIAGNOSIS — Z803 Family history of malignant neoplasm of breast: Secondary | ICD-10-CM | POA: Insufficient documentation

## 2024-06-22 DIAGNOSIS — Z806 Family history of leukemia: Secondary | ICD-10-CM | POA: Diagnosis not present

## 2024-06-22 DIAGNOSIS — Z79899 Other long term (current) drug therapy: Secondary | ICD-10-CM | POA: Diagnosis not present

## 2024-06-22 DIAGNOSIS — Z808 Family history of malignant neoplasm of other organs or systems: Secondary | ICD-10-CM | POA: Diagnosis not present

## 2024-06-22 DIAGNOSIS — Z79620 Long term (current) use of immunosuppressive biologic: Secondary | ICD-10-CM | POA: Insufficient documentation

## 2024-06-22 DIAGNOSIS — N183 Chronic kidney disease, stage 3 unspecified: Secondary | ICD-10-CM | POA: Diagnosis not present

## 2024-06-22 DIAGNOSIS — Z8041 Family history of malignant neoplasm of ovary: Secondary | ICD-10-CM | POA: Diagnosis not present

## 2024-06-22 DIAGNOSIS — D472 Monoclonal gammopathy: Secondary | ICD-10-CM

## 2024-06-22 DIAGNOSIS — Z800 Family history of malignant neoplasm of digestive organs: Secondary | ICD-10-CM | POA: Diagnosis not present

## 2024-06-22 LAB — CMP (CANCER CENTER ONLY)
ALT: 46 U/L — ABNORMAL HIGH (ref 0–44)
AST: 62 U/L — ABNORMAL HIGH (ref 15–41)
Albumin: 4.6 g/dL (ref 3.5–5.0)
Alkaline Phosphatase: 82 U/L (ref 38–126)
Anion gap: 15 (ref 5–15)
BUN: 16 mg/dL (ref 8–23)
CO2: 21 mmol/L — ABNORMAL LOW (ref 22–32)
Calcium: 10.9 mg/dL — ABNORMAL HIGH (ref 8.9–10.3)
Chloride: 102 mmol/L (ref 98–111)
Creatinine: 1.15 mg/dL — ABNORMAL HIGH (ref 0.44–1.00)
GFR, Estimated: 50 mL/min — ABNORMAL LOW (ref >60)
Glucose, Bld: 88 mg/dL (ref 70–99)
Potassium: 4.1 mmol/L (ref 3.5–5.1)
Sodium: 138 mmol/L (ref 135–145)
Total Bilirubin: 0.8 mg/dL (ref 0.0–1.2)
Total Protein: 8.3 g/dL — ABNORMAL HIGH (ref 6.5–8.1)

## 2024-06-22 LAB — IRON AND TIBC
Iron: 107 ug/dL (ref 28–170)
Saturation Ratios: 35 % — ABNORMAL HIGH (ref 10.4–31.8)
TIBC: 305 ug/dL (ref 250–450)
UIBC: 198 ug/dL

## 2024-06-22 LAB — CBC WITH DIFFERENTIAL (CANCER CENTER ONLY)
Abs Immature Granulocytes: 0.02 K/uL (ref 0.00–0.07)
Basophils Absolute: 0 K/uL (ref 0.0–0.1)
Basophils Relative: 0 %
Eosinophils Absolute: 0.1 K/uL (ref 0.0–0.5)
Eosinophils Relative: 1 %
HCT: 42.7 % (ref 36.0–46.0)
Hemoglobin: 14.7 g/dL (ref 12.0–15.0)
Immature Granulocytes: 0 %
Lymphocytes Relative: 45 %
Lymphs Abs: 2.6 K/uL (ref 0.7–4.0)
MCH: 32 pg (ref 26.0–34.0)
MCHC: 34.4 g/dL (ref 30.0–36.0)
MCV: 93 fL (ref 80.0–100.0)
Monocytes Absolute: 0.8 K/uL (ref 0.1–1.0)
Monocytes Relative: 14 %
Neutro Abs: 2.4 K/uL (ref 1.7–7.7)
Neutrophils Relative %: 40 %
Platelet Count: 160 K/uL (ref 150–400)
RBC: 4.59 MIL/uL (ref 3.87–5.11)
RDW: 13.8 % (ref 11.5–15.5)
WBC Count: 5.9 K/uL (ref 4.0–10.5)
nRBC: 0 % (ref 0.0–0.2)

## 2024-06-22 LAB — TSH: TSH: 6.06 u[IU]/mL — ABNORMAL HIGH (ref 0.350–4.500)

## 2024-06-22 LAB — VITAMIN B12: Vitamin B-12: 1096 pg/mL — ABNORMAL HIGH (ref 180–914)

## 2024-06-22 LAB — FERRITIN: Ferritin: 78 ng/mL (ref 11–307)

## 2024-06-22 LAB — FOLATE: Folate: >20 ng/mL (ref >5.9)

## 2024-06-22 NOTE — Telephone Encounter (Signed)
 Patient has been scheduled for follow-up visit per 06/22/24 LOS.  Pt aware of scheduled appt details.

## 2024-06-25 LAB — MULTIPLE MYELOMA PANEL, SERUM
Albumin SerPl Elph-Mcnc: 4.4 g/dL (ref 2.9–4.4)
Albumin/Glob SerPl: 1.2 (ref 0.7–1.7)
Alpha 1: 0.2 g/dL (ref 0.0–0.4)
Alpha2 Glob SerPl Elph-Mcnc: 0.8 g/dL (ref 0.4–1.0)
B-Globulin SerPl Elph-Mcnc: 1.1 g/dL (ref 0.7–1.3)
Gamma Glob SerPl Elph-Mcnc: 1.7 g/dL (ref 0.4–1.8)
Globulin, Total: 3.8 g/dL (ref 2.2–3.9)
IgA: 326 mg/dL (ref 64–422)
IgG (Immunoglobin G), Serum: 1471 mg/dL (ref 586–1602)
IgM (Immunoglobulin M), Srm: 194 mg/dL (ref 26–217)
M Protein SerPl Elph-Mcnc: 0.1 g/dL — ABNORMAL HIGH
Total Protein ELP: 8.2 g/dL (ref 6.0–8.5)

## 2024-07-06 ENCOUNTER — Telehealth: Payer: Self-pay | Admitting: Hematology and Oncology

## 2024-07-06 ENCOUNTER — Other Ambulatory Visit: Payer: Self-pay

## 2024-07-06 ENCOUNTER — Inpatient Hospital Stay: Attending: Hematology and Oncology | Admitting: Hematology and Oncology

## 2024-07-06 ENCOUNTER — Inpatient Hospital Stay

## 2024-07-06 VITALS — BP 139/63 | HR 60 | Resp 16 | Ht 64.5 in | Wt 131.1 lb

## 2024-07-06 DIAGNOSIS — Z800 Family history of malignant neoplasm of digestive organs: Secondary | ICD-10-CM | POA: Insufficient documentation

## 2024-07-06 DIAGNOSIS — Z803 Family history of malignant neoplasm of breast: Secondary | ICD-10-CM | POA: Diagnosis not present

## 2024-07-06 DIAGNOSIS — D472 Monoclonal gammopathy: Secondary | ICD-10-CM | POA: Diagnosis present

## 2024-07-06 DIAGNOSIS — Z808 Family history of malignant neoplasm of other organs or systems: Secondary | ICD-10-CM | POA: Diagnosis not present

## 2024-07-06 DIAGNOSIS — Z806 Family history of leukemia: Secondary | ICD-10-CM | POA: Insufficient documentation

## 2024-07-06 DIAGNOSIS — N183 Chronic kidney disease, stage 3 unspecified: Secondary | ICD-10-CM | POA: Diagnosis not present

## 2024-07-06 DIAGNOSIS — Z8041 Family history of malignant neoplasm of ovary: Secondary | ICD-10-CM | POA: Diagnosis not present

## 2024-07-06 DIAGNOSIS — Z79899 Other long term (current) drug therapy: Secondary | ICD-10-CM | POA: Diagnosis not present

## 2024-07-06 NOTE — Telephone Encounter (Signed)
 Patient has been scheduled for follow-up visit per 07/06/24 LOS.  Pt given an appt calendar with date and time.

## 2024-07-28 NOTE — Progress Notes (Unsigned)
 [facility] [address] [place],  [place]  [address] [phone]  Clinic Day:  07/28/2024   Referring physician: Tobie Gordy POUR, MD  Patient Care Team: Patient Care Team: Tod Ivanoff, NP as PCP - General (Obstetrics and Gynecology) Tobie Gordy POUR, MD as Consulting Physician (Nephrology)   REASON FOR CONSULTATION:  Monoclonal gammopathy  HISTORY OF PRESENT ILLNESS:   Glenda Stewart is a 73 y.o. female with a history of monoclonal gammopathy who is referred in consultation by Gordy Tobie, MD for assessment and management. She was noted to have a M-spike (0.2) on recent labs with her PCP. She is noted to have chronic kidney disease, classified as a stage 3. She denies any fever, chills, nausea or vomiting. She denies shortness of breath, chest pain or cough. She denies issue with bowel or bladder. She denies changes in appetite or weight. Medical, surgical and family history as below.    REVIEW OF SYSTEMS:   Review of Systems  Constitutional: Negative.   HENT:  Negative.    Eyes: Negative.   Respiratory: Negative.    Cardiovascular: Negative.   Gastrointestinal: Negative.   Endocrine: Negative.   Genitourinary: Negative.    Musculoskeletal: Negative.   Skin: Negative.   Neurological: Negative.   Hematological: Negative.   Psychiatric/Behavioral: Negative.       VITALS:   Blood pressure 139/66, pulse (!) 58, temperature 97.7 F (36.5 C), temperature source Oral, resp. rate 14, height 5' 4.5 (1.638 m), weight 130 lb 4.8 oz (59.1 kg), SpO2 100%.  Wt Readings from Last 3 Encounters:  07/06/24 131 lb 1.6 oz (59.5 kg)  06/22/24 130 lb 4.8 oz (59.1 kg)    Body mass index is 22.02 kg/m.  Performance status (ECOG): 0 - Asymptomatic  PHYSICAL EXAM:   Physical Exam Constitutional:      Appearance: Normal appearance. She is normal weight.  HENT:     Head: Normocephalic and atraumatic.     Mouth/Throat:     Mouth: Mucous membranes are moist.   Cardiovascular:     Rate and Rhythm: Normal rate and regular rhythm.     Pulses: Normal pulses.     Heart sounds: Normal heart sounds.  Pulmonary:     Effort: Pulmonary effort is normal.     Breath sounds: Normal breath sounds.  Abdominal:     General: Bowel sounds are normal.  Musculoskeletal:        General: Normal range of motion.  Skin:    General: Skin is warm and dry.  Neurological:     General: No focal deficit present.     Mental Status: She is alert and oriented to person, place, and time. Mental status is at baseline.  Psychiatric:        Mood and Affect: Mood normal.        Behavior: Behavior normal.        Thought Content: Thought content normal.        Judgment: Judgment normal.      LABS:      Latest Ref Rng & Units 06/22/2024   12:10 PM  CBC  WBC 4.0 - 10.5 K/uL 5.9   Hemoglobin 12.0 - 15.0 g/dL 85.2   Hematocrit 63.9 - 46.0 % 42.7   Platelets 150 - 400 K/uL 160       Latest Ref Rng & Units 06/22/2024   12:10 PM  CMP  Glucose 70 - 99 mg/dL 88   BUN 8 - 23 mg/dL  16   Creatinine 0.44 - 1.00 mg/dL 8.84   Sodium 864 - 854 mmol/L 138   Potassium 3.5 - 5.1 mmol/L 4.1   Chloride 98 - 111 mmol/L 102   CO2 22 - 32 mmol/L 21   Calcium 8.9 - 10.3 mg/dL 89.0   Total Protein 6.5 - 8.1 g/dL 8.3   Total Bilirubin 0.0 - 1.2 mg/dL 0.8   Alkaline Phos 38 - 126 U/L 82   AST 15 - 41 U/L 62   ALT 0 - 44 U/L 46      No results found for: CEA1, CEA / No results found for: CEA1, CEA No results found for: PSA1 No results found for: CAN199 No results found for: RJW874  Lab Results  Component Value Date   TOTALPROTELP 8.2 06/22/2024   Lab Results  Component Value Date   TIBC 305 06/22/2024   FERRITIN 78 06/22/2024   IRONPCTSAT 35 (H) 06/22/2024   No results found for: LDH  STUDIES:   No results found.    HISTORY:   Past Medical History:  Diagnosis Date   Diarrhea of presumed infectious origin    Genetic testing 04/23/2017   Ms.  Devine underwent genetic counseling and testing for hereditary cancer syndromes on 04/11/2017. Her results were negative for mutations in all 83 genes analyzed by [facility]'s 83-gene Multi-Cancers Panel. Genes analyzed include: ALK, APC, ATM, AXIN2, BAP1, BARD1, BLM, BMPR1A, BRCA1, BRCA2, BRIP1, CASR, CDC73, CDH1, CDK4, CDKN1B, CDKN1C, CDKN2A, CEBPA, CHEK2, CTNNA1, DICER1, DIS3L2, EGFR, EPCAM,   GERD (gastroesophageal reflux disease)    Herpes simplex    Hyperlipidemia    Multinodular thyroid     Osteopenia    Peptic ulcer disease    Peripheral vascular disease    S/P breast reconstruction, bilateral    Skin cancer    Urticaria     Past Surgical History:  Procedure Laterality Date   REDUCTION MAMMAPLASTY Bilateral August 16, 2017    Family History  Problem Relation Age of Onset   Hypertension Mother    Breast cancer Mother 66       d.92 bilateral breast cancers diagnosed at 36 and 57   Osteoporosis Mother    Hypertension Father    Breast cancer Sister 38       treated with lumpectomy and radiation   Diabetes Sister    Breast cancer Maternal Aunt 85       d.90   Breast cancer Maternal Aunt 60       d.87   Ovarian cancer Maternal Aunt 1977-08-16   Early death Maternal Uncle        d.child from unknown cause   Leukemia Paternal Uncle        d.68s   Cancer Paternal Uncle        d.60s unspecified type of cancer   Breast cancer Cousin 39   Brain cancer Cousin 18       Neuroblastoma treated with resection and radiation. Daughter of maternal aunt Velia.   Breast cancer Cousin 69       Daughter of maternal aunt Nichole.   Colon cancer Cousin 68       Son of maternal aunt Velia.   Colon cancer Cousin        Daughter of paternal uncle Rod. Treated at [facility] for unspecified form of cancer.    Social History:  reports that she has never smoked. She has never used smokeless tobacco. She reports current alcohol use. She reports that she does not use drugs.The patient  is accompanied by husband  today.  Allergies:  Allergies  Allergen Reactions   Aspirin Other (See Comments)    Per patient, don't take due to stomach GI doctor advice    Current Medications: Current Outpatient Medications  Medication Sig Dispense Refill   amLODipine (NORVASC) 10 MG tablet Take 10 mg by mouth daily.     Calcium Carb-Cholecalciferol (OYSTER SHELL CALCIUM W/D) 500-5 MG-MCG TABS Take 1 tablet by mouth 2 (two) times daily with a meal.     cholecalciferol (VITAMIN D3) 25 MCG (1000 UNIT) tablet Take 1,000 Units by mouth daily.     escitalopram (LEXAPRO) 10 MG tablet Take 10 mg by mouth daily.     Multiple Vitamin (MULTI-VITAMIN) tablet Take 1 tablet by mouth daily.     rosuvastatin (CRESTOR) 40 MG tablet Take 40 mg by mouth daily.     valACYclovir (VALTREX) 1000 MG tablet Take 1,000 mg by mouth 2 (two) times daily.     Ascorbic Acid (VITAMIN C PO) Take by mouth.     estradiol (ESTRACE) 0.1 MG/GM vaginal cream Place 1 Applicatorful vaginally every 14 (fourteen) days. (Patient not taking: Reported on 06/22/2024)     Omega-3 Fatty Acids (FISH OIL BURP-LESS) 1200 MG CAPS Take by mouth daily at 12 noon. (Patient not taking: Reported on 06/22/2024)     pantoprazole (PROTONIX) 40 MG tablet Take 40 mg by mouth daily. (Patient not taking: Reported on 06/22/2024)     VITAMIN K PO Take by mouth.     No current facility-administered medications for this visit.     ASSESSMENT & PLAN:   Assessment:  Glenda Stewart is a 73 y.o. female with noted multiple M spikes (0.2) on recent SPEP. We discussed that while monoclonal gammopathy is not a cancer itself, it can be a precursor to blood cancers including multiple myeloma. She is asymptomatic and understands that we will want to follow her closely. She is agreeable to this plan.   Plan: 1.  She will return to clinic in 3 months for repeat evaluation.   I discussed the assessment and treatment plan with the patient.  The patient was provided an opportunity to ask  questions and all were answered.  The patient agreed with the plan and demonstrated an understanding of the instructions.    Thank you for the referral    45 minutes was spent in patient care.  This included time spent preparing to see the patient (e.g., review of tests), obtaining and/or reviewing separately obtained history, counseling and educating the patient/family/caregiver, ordering medications, tests, or procedures; documenting clinical information in the electronic or other health record, independently interpreting results and communicating results to the patient/family/caregiver as well as coordination of care.      Eleanor DELENA Bach, NP   Family Nurse Practitioner - Board Certified [facility] [phone]

## 2024-07-30 NOTE — Progress Notes (Signed)
 [facility] [address] [place],  [place]  [address] [phone]  Clinic Day:  07/30/2024   Referring physician: Tod Ivanoff, NP  Patient Care Team: Patient Care Team: Tod Ivanoff, NP as PCP - General (Obstetrics and Gynecology) Tobie Gordy POUR, MD as Consulting Physician (Nephrology)   REASON FOR CONSULTATION:  Monoclonal gammopathy  HISTORY OF PRESENT ILLNESS:   Glenda Stewart is a 73 y.o. female with a history of monoclonal gammopathy who is referred in consultation by Gordy Tobie, MD for assessment and management. She was noted to have a M-spike (0.2) on recent labs with her PCP. She is noted to have chronic kidney disease, classified as a stage 3. She denies any fever, chills, nausea or vomiting. She denies shortness of breath, chest pain or cough. She denies issue with bowel or bladder. She denies changes in appetite or weight. Medical, surgical and family history as below.    REVIEW OF SYSTEMS:   Review of Systems  Constitutional: Negative.   HENT:  Negative.    Eyes: Negative.   Respiratory: Negative.    Cardiovascular: Negative.   Gastrointestinal: Negative.   Endocrine: Negative.   Genitourinary: Negative.    Musculoskeletal: Negative.   Skin: Negative.   Neurological: Negative.   Hematological: Negative.   Psychiatric/Behavioral: Negative.       VITALS:   Blood pressure 139/63, pulse 60, resp. rate 16, height 5' 4.5 (1.638 m), weight 131 lb 1.6 oz (59.5 kg), SpO2 99%.  Wt Readings from Last 3 Encounters:  07/06/24 131 lb 1.6 oz (59.5 kg)  06/22/24 130 lb 4.8 oz (59.1 kg)    Body mass index is 22.16 kg/m.  Performance status (ECOG): 0 - Asymptomatic  PHYSICAL EXAM:   Physical Exam Constitutional:      Appearance: Normal appearance. She is normal weight.  HENT:     Head: Normocephalic and atraumatic.     Mouth/Throat:     Mouth: Mucous membranes are moist.  Cardiovascular:     Rate and Rhythm: Normal rate and regular  rhythm.     Pulses: Normal pulses.     Heart sounds: Normal heart sounds.  Pulmonary:     Effort: Pulmonary effort is normal.     Breath sounds: Normal breath sounds.  Abdominal:     General: Bowel sounds are normal.  Musculoskeletal:        General: Normal range of motion.  Skin:    General: Skin is warm and dry.  Neurological:     General: No focal deficit present.     Mental Status: She is alert and oriented to person, place, and time. Mental status is at baseline.  Psychiatric:        Mood and Affect: Mood normal.        Behavior: Behavior normal.        Thought Content: Thought content normal.        Judgment: Judgment normal.      LABS:      Latest Ref Rng & Units 06/22/2024   12:10 PM  CBC  WBC 4.0 - 10.5 K/uL 5.9   Hemoglobin 12.0 - 15.0 g/dL 85.2   Hematocrit 63.9 - 46.0 % 42.7   Platelets 150 - 400 K/uL 160       Latest Ref Rng & Units 06/22/2024   12:10 PM  CMP  Glucose 70 - 99 mg/dL 88   BUN 8 - 23 mg/dL 16   Creatinine 9.55 - 1.00 mg/dL 8.84  Sodium 135 - 145 mmol/L 138   Potassium 3.5 - 5.1 mmol/L 4.1   Chloride 98 - 111 mmol/L 102   CO2 22 - 32 mmol/L 21   Calcium 8.9 - 10.3 mg/dL 89.0   Total Protein 6.5 - 8.1 g/dL 8.3   Total Bilirubin 0.0 - 1.2 mg/dL 0.8   Alkaline Phos 38 - 126 U/L 82   AST 15 - 41 U/L 62   ALT 0 - 44 U/L 46      No results found for: CEA1, CEA / No results found for: CEA1, CEA No results found for: PSA1 No results found for: CAN199 No results found for: RJW874  Lab Results  Component Value Date   TOTALPROTELP 8.2 06/22/2024   Lab Results  Component Value Date   TIBC 305 06/22/2024   FERRITIN 78 06/22/2024   IRONPCTSAT 35 (H) 06/22/2024   No results found for: LDH  STUDIES:   No results found.    HISTORY:   Past Medical History:  Diagnosis Date   Diarrhea of presumed infectious origin    Genetic testing 04/23/2017   Glenda Stewart underwent genetic counseling and testing for hereditary  cancer syndromes on 04/11/2017. Her results were negative for mutations in all 83 genes analyzed by [facility]'s 83-gene Multi-Cancers Panel. Genes analyzed include: ALK, APC, ATM, AXIN2, BAP1, BARD1, BLM, BMPR1A, BRCA1, BRCA2, BRIP1, CASR, CDC73, CDH1, CDK4, CDKN1B, CDKN1C, CDKN2A, CEBPA, CHEK2, CTNNA1, DICER1, DIS3L2, EGFR, EPCAM,   GERD (gastroesophageal reflux disease)    Herpes simplex    Hyperlipidemia    Multinodular thyroid     Osteopenia    Peptic ulcer disease    Peripheral vascular disease    S/P breast reconstruction, bilateral    Skin cancer    Urticaria     Past Surgical History:  Procedure Laterality Date   REDUCTION MAMMAPLASTY Bilateral August 13, 2017    Family History  Problem Relation Age of Onset   Hypertension Mother    Breast cancer Mother 58       d.92 bilateral breast cancers diagnosed at 38 and 59   Osteoporosis Mother    Hypertension Father    Breast cancer Sister 41       treated with lumpectomy and radiation   Diabetes Sister    Breast cancer Maternal Aunt 85       d.90   Breast cancer Maternal Aunt 60       d.87   Ovarian cancer Maternal Aunt August 13, 1977   Early death Maternal Uncle        d.child from unknown cause   Leukemia Paternal Uncle        d.68s   Cancer Paternal Uncle        d.60s unspecified type of cancer   Breast cancer Cousin 32   Brain cancer Cousin 18       Neuroblastoma treated with resection and radiation. Daughter of maternal aunt Velia.   Breast cancer Cousin 39       Daughter of maternal aunt Nichole.   Colon cancer Cousin 9       Son of maternal aunt Velia.   Colon cancer Cousin        Daughter of paternal uncle Rod. Treated at [facility] for unspecified form of cancer.    Social History:  reports that she has never smoked. She has never used smokeless tobacco. She reports current alcohol use. She reports that she does not use drugs.The patient is accompanied by husband today.  Allergies:  Allergies  Allergen  Reactions   Aspirin Other (See  Comments)    Per patient, don't take due to stomach GI doctor advice    Current Medications: Current Outpatient Medications  Medication Sig Dispense Refill   amLODipine (NORVASC) 10 MG tablet Take 10 mg by mouth daily.     Ascorbic Acid (VITAMIN C PO) Take by mouth.     Calcium Carb-Cholecalciferol (OYSTER SHELL CALCIUM W/D) 500-5 MG-MCG TABS Take 1 tablet by mouth 2 (two) times daily with a meal.     cholecalciferol (VITAMIN D3) 25 MCG (1000 UNIT) tablet Take 1,000 Units by mouth daily.     escitalopram (LEXAPRO) 10 MG tablet Take 10 mg by mouth daily.     Multiple Vitamin (MULTI-VITAMIN) tablet Take 1 tablet by mouth daily.     rosuvastatin (CRESTOR) 40 MG tablet Take 40 mg by mouth daily.     valACYclovir (VALTREX) 1000 MG tablet Take 1,000 mg by mouth 2 (two) times daily.     VITAMIN K PO Take by mouth.     estradiol (ESTRACE) 0.1 MG/GM vaginal cream Place 1 Applicatorful vaginally every 14 (fourteen) days. (Patient not taking: Reported on 06/22/2024)     Omega-3 Fatty Acids (FISH OIL BURP-LESS) 1200 MG CAPS Take by mouth daily at 12 noon. (Patient not taking: Reported on 06/22/2024)     pantoprazole (PROTONIX) 40 MG tablet Take 40 mg by mouth daily. (Patient not taking: Reported on 06/22/2024)     No current facility-administered medications for this visit.     ASSESSMENT & PLAN:   Assessment:  Glenda Stewart is a 73 y.o. female with noted multiple M spikes (0.2) on recent SPEP. We discussed that while monoclonal gammopathy is not a cancer itself, it can be a precursor to blood cancers including multiple myeloma. She is asymptomatic and understands that we will want to follow her closely. She is agreeable to this plan. M spike is down to 0.1 today with no new concerns.   Plan: 1.  She will return to clinic in 3 months for repeat evaluation.   I discussed the assessment and treatment plan with the patient.  The patient was provided an opportunity to ask questions and all were  answered.  The patient agreed with the plan and demonstrated an understanding of the instructions.    Thank you for the referral    20 minutes was spent in patient care.  This included time spent preparing to see the patient (e.g., review of tests), obtaining and/or reviewing separately obtained history, counseling and educating the patient/family/caregiver, ordering medications, tests, or procedures; documenting clinical information in the electronic or other health record, independently interpreting results and communicating results to the patient/family/caregiver as well as coordination of care.      Eleanor DELENA Bach, NP   Family Nurse Practitioner - Board Certified [facility] [phone]

## 2025-01-04 ENCOUNTER — Inpatient Hospital Stay: Admitting: Hematology and Oncology

## 2025-01-04 ENCOUNTER — Inpatient Hospital Stay
